# Patient Record
Sex: Male | Born: 2000 | Race: Black or African American | Hispanic: No | Marital: Single | Smoking: Never smoker
Health system: Southern US, Community
[De-identification: ages and names within clinical notes are randomized; demographics above are authoritative.]

## PROBLEM LIST (undated history)

## (undated) DIAGNOSIS — J3501 Chronic tonsillitis: Secondary | ICD-10-CM

## (undated) DIAGNOSIS — M41129 Adolescent idiopathic scoliosis, site unspecified: Secondary | ICD-10-CM

## (undated) DIAGNOSIS — H9325 Central auditory processing disorder: Secondary | ICD-10-CM

## (undated) DIAGNOSIS — K2100 Gastro-esophageal reflux disease with esophagitis, without bleeding: Secondary | ICD-10-CM

## (undated) DIAGNOSIS — G43909 Migraine, unspecified, not intractable, without status migrainosus: Secondary | ICD-10-CM

## (undated) DIAGNOSIS — T7840XA Allergy, unspecified, initial encounter: Secondary | ICD-10-CM

## (undated) HISTORY — DX: Adolescent idiopathic scoliosis, site unspecified: M41.129

## (undated) HISTORY — DX: Chronic tonsillitis: J35.01

## (undated) HISTORY — DX: Allergy, unspecified, initial encounter: T78.40XA

## (undated) HISTORY — PX: DEEP NECK LYMPH NODE BIOPSY / EXCISION: SUR126

## (undated) HISTORY — DX: Central auditory processing disorder: H93.25

## (undated) HISTORY — PX: CIRCUMCISION: SUR203

## (undated) HISTORY — DX: Gastro-esophageal reflux disease with esophagitis, without bleeding: K21.00

---
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
## 2022-01-17 ENCOUNTER — Other Ambulatory Visit: Payer: Self-pay

## 2022-01-17 ENCOUNTER — Emergency Department (HOSPITAL_BASED_OUTPATIENT_CLINIC_OR_DEPARTMENT_OTHER)
Admission: EM | Admit: 2022-01-17 | Discharge: 2022-01-17 | Disposition: A | Discharge status: Home / Self Care | Payer: BC Managed Care – PPO | Attending: Emergency Medicine | Admitting: Emergency Medicine

## 2022-01-17 ENCOUNTER — Encounter (HOSPITAL_BASED_OUTPATIENT_CLINIC_OR_DEPARTMENT_OTHER): Payer: Self-pay | Admitting: Pediatrics

## 2022-01-17 DIAGNOSIS — E878 Other disorders of electrolyte and fluid balance, not elsewhere classified: Secondary | ICD-10-CM | POA: Insufficient documentation

## 2022-01-17 DIAGNOSIS — R112 Nausea with vomiting, unspecified: Secondary | ICD-10-CM | POA: Diagnosis present

## 2022-01-17 DIAGNOSIS — D72829 Elevated white blood cell count, unspecified: Secondary | ICD-10-CM | POA: Diagnosis not present

## 2022-01-17 LAB — CBC
HCT: 44.4 % (ref 39.0–52.0)
Hemoglobin: 16.4 g/dL (ref 13.0–17.0)
MCH: 32.7 pg (ref 26.0–34.0)
MCHC: 36.9 g/dL — ABNORMAL HIGH (ref 30.0–36.0)
MCV: 88.4 fL (ref 80.0–100.0)
Platelets: 259 10*3/uL (ref 150–400)
RBC: 5.02 MIL/uL (ref 4.22–5.81)
RDW: 11.9 % (ref 11.5–15.5)
WBC: 12 10*3/uL — ABNORMAL HIGH (ref 4.0–10.5)
nRBC: 0 % (ref 0.0–0.2)

## 2022-01-17 LAB — COMPREHENSIVE METABOLIC PANEL
ALT: 27 U/L (ref 0–44)
AST: 28 U/L (ref 15–41)
Albumin: 4.9 g/dL (ref 3.5–5.0)
Alkaline Phosphatase: 95 U/L (ref 38–126)
Anion gap: 13 (ref 5–15)
BUN: 12 mg/dL (ref 6–20)
CO2: 21 mmol/L — ABNORMAL LOW (ref 22–32)
Calcium: 9.7 mg/dL (ref 8.9–10.3)
Chloride: 104 mmol/L (ref 98–111)
Creatinine, Ser: 0.77 mg/dL (ref 0.61–1.24)
GFR, Estimated: >60 mL/min (ref >60)
Glucose, Bld: 99 mg/dL (ref 70–99)
Potassium: 3.5 mmol/L (ref 3.5–5.1)
Sodium: 138 mmol/L (ref 135–145)
Total Bilirubin: 1.5 mg/dL — ABNORMAL HIGH (ref 0.3–1.2)
Total Protein: 8.7 g/dL — ABNORMAL HIGH (ref 6.5–8.1)

## 2022-01-17 LAB — LIPASE, BLOOD: Lipase: 25 U/L (ref 11–51)

## 2022-01-17 LAB — URINALYSIS, ROUTINE W REFLEX MICROSCOPIC
Bilirubin Urine: NEGATIVE
Glucose, UA: NEGATIVE mg/dL
Ketones, ur: >=80 mg/dL — AB
Leukocytes,Ua: NEGATIVE
Nitrite: NEGATIVE
Protein, ur: >=300 mg/dL — AB
Specific Gravity, Urine: >=1.03 (ref 1.005–1.030)
pH: 6 (ref 5.0–8.0)

## 2022-01-17 LAB — URINALYSIS, MICROSCOPIC (REFLEX)

## 2022-01-17 MED ORDER — PROMETHAZINE HCL 25 MG PO TABS: 25.0000 mg | ORAL_TABLET | Freq: Four times a day (QID) | ORAL | 0 refills | Status: DC | PRN

## 2022-01-17 MED ORDER — ONDANSETRON 4 MG PO TBDP
4.0000 mg | ORAL_TABLET | Freq: Once | ORAL | Status: AC | PRN
  Administered 2022-01-17: 4 mg via ORAL
  Filled 2022-01-17: qty 1

## 2022-01-17 MED ORDER — LACTATED RINGERS IV BOLUS
1000.0000 mL | Freq: Once | INTRAVENOUS | Status: AC
  Administered 2022-01-17: 1000 mL via INTRAVENOUS

## 2022-01-17 MED ORDER — SODIUM CHLORIDE 0.9 % IV SOLN
25.0000 mg | Freq: Once | INTRAVENOUS | Status: AC
  Administered 2022-01-17: 25 mg via INTRAVENOUS
  Filled 2022-01-17: qty 1

## 2022-01-17 MED ORDER — PROMETHAZINE HCL 25 MG/ML IJ SOLN
INTRAMUSCULAR | Status: AC
  Filled 2022-01-17: qty 1

## 2022-01-17 MED ORDER — FAMOTIDINE IN NACL 20-0.9 MG/50ML-% IV SOLN
20.0000 mg | Freq: Once | INTRAVENOUS | Status: AC
  Administered 2022-01-17: 20 mg via INTRAVENOUS
  Filled 2022-01-17: qty 50

## 2022-01-17 NOTE — ED Provider Notes (Signed)
[facility] EMERGENCY DEPARTMENT Provider Note   CSN: [id number] Arrival date & time: 01/17/22  0856     History  Chief Complaint  Patient presents with   Nausea   Emesis    Donald Curtis is a 21 y.o. male.  He has no significant past medical history.  Complaining of nausea and vomiting that started 2 days ago.  Worse since midnight.  No abdominal pain fevers diarrhea.  Question saw small amount of blood today.  Similar episode about a year ago.  No troubles in between.  No sick contacts or recent travel.  The history is provided by the patient and a parent.  Emesis Severity:  Severe Duration:  3 days Timing:  Intermittent Progression:  Worsening Chronicity:  Recurrent Recent urination:  Normal Relieved by:  Nothing Worsened by:  Nothing Ineffective treatments:  Antiemetics Associated symptoms: no abdominal pain, no chills, no cough, no diarrhea, no fever and no sore throat   Risk factors: no prior abdominal surgery, no sick contacts, no suspect food intake and no travel to endemic areas        Home Medications Prior to Admission medications   Medication Sig Start Date End Date Taking? Authorizing Provider  famotidine (PEPCID) 40 MG tablet Take 1 tablet (40 mg total) by mouth daily. 10/25/21  Yes Plotnikov, Georgina Quint, MD  FLUoxetine (PROZAC) 20 MG capsule Take 1 capsule (20 mg total) by mouth daily. 12/07/21 12/07/22 Yes Plotnikov, Georgina Quint, MD  loratadine (CLARITIN) 10 MG tablet Take 1 tablet (10 mg total) by mouth daily. 02/20/19  Yes Georgiann Hahn, MD  Multiple Vitamin (MULTIVITAMIN) tablet Take 1 tablet by mouth daily.   Yes [provider]      Allergies    Patient has no known allergies.    Review of Systems   Review of Systems  Constitutional:  Negative for chills and fever.  HENT:  Negative for sore throat.   Respiratory:  Negative for cough.   Gastrointestinal:  Positive for nausea and vomiting. Negative for abdominal pain and  diarrhea.    Physical Exam Updated Vital Signs BP 135/83 (BP Location: Right Arm)   Pulse 84   Temp 97.9 F (36.6 C) (Oral)   Resp 18   Ht 5\' 8"  (1.727 m)   Wt 46.6 kg   SpO2 99%   BMI 15.62 kg/m  Physical Exam Vitals and nursing note reviewed.  Constitutional:      General: He is not in acute distress.    Appearance: Normal appearance. He is well-developed.  HENT:     Head: Normocephalic and atraumatic.  Eyes:     Conjunctiva/sclera: Conjunctivae normal.  Cardiovascular:     Rate and Rhythm: Normal rate and regular rhythm.     Heart sounds: No murmur heard. Pulmonary:     Effort: Pulmonary effort is normal. No respiratory distress.     Breath sounds: Normal breath sounds.  Abdominal:     General: There is no distension.     Palpations: Abdomen is soft. There is no mass.     Tenderness: There is no abdominal tenderness. There is no guarding or rebound.     Hernia: No hernia is present.  Musculoskeletal:        General: No swelling.     Cervical back: Neck supple.  Skin:    General: Skin is warm and dry.     Capillary Refill: Capillary refill takes less than 2 seconds.  Neurological:     General:  No focal deficit present.     Mental Status: He is alert.     ED Results / Procedures / Treatments   Labs (all labs ordered are listed, but only abnormal results are displayed) Labs Reviewed  COMPREHENSIVE METABOLIC PANEL - Abnormal; Notable for the following components:      Result Value   CO2 21 (*)    Total Protein 8.7 (*)    Total Bilirubin 1.5 (*)    All other components within normal limits  CBC - Abnormal; Notable for the following components:   WBC 12.0 (*)    MCHC 36.9 (*)    All other components within normal limits  URINALYSIS, ROUTINE W REFLEX MICROSCOPIC - Abnormal; Notable for the following components:   APPearance HAZY (*)    Hgb urine dipstick TRACE (*)    Ketones, ur >=80 (*)    Protein, ur >=300 (*)    All other components within normal  limits  URINALYSIS, MICROSCOPIC (REFLEX) - Abnormal; Notable for the following components:   Bacteria, UA MANY (*)    All other components within normal limits  LIPASE, BLOOD    EKG None  Radiology No results found.  Procedures Procedures    Medications Ordered in ED Medications  lactated ringers bolus 1,000 mL (has no administration in time range)  promethazine (PHENERGAN) 25 mg in sodium chloride 0.9 % 50 mL IVPB (has no administration in time range)  ondansetron (ZOFRAN-ODT) disintegrating tablet 4 mg (4 mg Oral Given 01/17/22 0933)    ED Course/ Medical Decision Making/ A&P Clinical Course as of 01/17/22 1716  Mon Jan 17, 2022  1324 Patient feeling better now and would like to go home.  He is already on Pepcid.  Will provide prescription for Phenergan.  Recommended PCP follow-up and consideration for GI referral. [MB]    Clinical Course User Index [MB] Hayden Rasmussen, MD                           Medical Decision Making Amount and/or Complexity of Data Reviewed Labs: ordered.  Risk Prescription drug management.   This patient complains of nausea and vomiting; this involves an extensive number of treatment Options and is a complaint that carries with it a high risk of complications and morbidity. The differential includes nausea and vomiting, gastritis, peptic ulcer disease, hyperemesis, gastroenteritis  I ordered, reviewed and interpreted labs, which included CBC with mildly elevated white count normal hemoglobin, chemistries mildly low bicarb and elevated T. bili normal gap, urinalysis with ketones protein I ordered medication IV fluids IV nausea medication and acid medication and reviewed PMP when indicated. Additional history obtained from patient's mother Previous records obtained and reviewed in epic including prior ED visit 1 year ago for similar presentation Cardiac monitoring reviewed, normal sinus rhythm Social determinants considered, no significant  barrier Critical Interventions: None  After the interventions stated above, I reevaluated the patient and found patient to be awake alert tolerating p.o. Admission and further testing considered, no indications for admission or further work-up at this time.  Recommended follow-up with PCP for further evaluation testing and possible GI referral.         Final Clinical Impression(s) / ED Diagnoses Final diagnoses:  Nausea and vomiting, unspecified vomiting type    Rx / DC Orders ED Discharge Orders          Ordered    promethazine (PHENERGAN) 25 MG tablet  Every 6 hours PRN  01/17/22 1325              Terrilee Files, MD 01/17/22 [phone]

## 2022-01-17 NOTE — ED Notes (Signed)
Pt ambulated to BR with steady gait.

## 2022-01-17 NOTE — ED Triage Notes (Signed)
C/O nausea and vomiting startin Friday; and became more consistent throughout the night Saturday, unable to keep anything down. Denies pain when asked.

## 2022-01-17 NOTE — ED Notes (Signed)
Attempted to call pharmacy for phenergan. No answer.

## 2022-01-17 NOTE — Discharge Instructions (Signed)
You were seen in the emergency department for nausea and vomiting.  You had lab work done that did not show an obvious explanation for your symptoms.  You felt better after some fluids nausea medication.  We are prescribing you nausea medication for home.  Please start with a clear liquid diet advance as tolerated.  Follow-up with your regular doctor and discuss with them possible referral to GI.

## 2022-01-17 NOTE — ED Notes (Signed)
Writer gave patient ginger ale for PO challenge.

## 2022-01-19 ENCOUNTER — Telehealth: Payer: Self-pay | Admitting: *Deleted

## 2022-01-19 NOTE — Telephone Encounter (Signed)
Transition Care Management Follow-up Telephone Call Date of discharge and from where: 01/17/22 1:26 pm How have you been since you were released from the hospital? Pt states fine Any questions or concerns? No  Items Reviewed: Did the pt receive and understand the discharge instructions provided? Yes  Medications obtained and verified? Yes and pt states they gave him some nausea meds Other?  Any new allergies since your discharge? No  Dietary orders reviewed? No Do you have support at home? Yes   Home Care and Equipment/Supplies: Were home health services ordered? not applicable If so, what is the name of the agency?   Has the agency set up a time to come to the patient's home? not applicable Were any new equipment or medical supplies ordered?  No What is the name of the medical supply agency?  Were you able to get the supplies/equipment? not applicable Do you have any questions related to the use of the equipment or supplies? No  Functional Questionnaire: (I = Independent and D = Dependent) ADLs: Yes  Bathing/Dressing- Yes   Meal Prep- no  Eating- yes  Maintaining continence- yes  Transferring/Ambulation- yes  Managing Meds- yes  Follow up appointments reviewed:  PCP Hospital f/u appt confirmed? Yes  Scheduled to see Dr. Alain Marion on 02/02/22  Specialist Hospital f/u appt confirmed? No   Are transportation arrangements needed? No  If their condition worsens, is the pt aware to call PCP or go to the Emergency Dept.? Yes Was the patient provided with contact information for the PCP's office or ED? Yes Was to pt encouraged to call back with questions or concerns? Yes 01/17/22

## 2022-02-02 ENCOUNTER — Encounter: Payer: Self-pay | Admitting: Internal Medicine

## 2022-02-02 ENCOUNTER — Ambulatory Visit: Payer: BC Managed Care – PPO | Admitting: Internal Medicine

## 2022-02-02 DIAGNOSIS — R110 Nausea: Secondary | ICD-10-CM | POA: Diagnosis not present

## 2022-02-02 MED ORDER — PROMETHAZINE HCL 25 MG PO TABS: 25.0000 mg | ORAL_TABLET | ORAL | 1 refills | Status: DC | PRN

## 2022-02-02 NOTE — Progress Notes (Signed)
Subjective:  Patient ID: Donald Curtis, male    DOB: 2001/02/03  Age: 21 y.o. MRN: [id number]  CC: Follow-up (Er FOLLOW-UP)   HPI Donald Curtis presents for a post ER visit for intractable nausea and vomiting on 01/17/2022.  He has had several episodes like this over past several years.  He did have a GI work-up done.  Nausea and vomiting seems to be able to stop with promethazine.  ER note/test from 01/17/2022 reviewed  The patient is here with his mother  Outpatient Medications Prior to Visit  Medication Sig Dispense Refill   famotidine (PEPCID) 40 MG tablet Take 1 tablet (40 mg total) by mouth daily. 90 tablet 3   FLUoxetine (PROZAC) 20 MG capsule Take 1 capsule (20 mg total) by mouth daily. 30 capsule 11   loratadine (CLARITIN) 10 MG tablet Take 1 tablet (10 mg total) by mouth daily. 30 tablet 6   Multiple Vitamin (MULTIVITAMIN) tablet Take 1 tablet by mouth daily.     promethazine (PHENERGAN) 25 MG tablet Take 1 tablet (25 mg total) by mouth every 6 (six) hours as needed for nausea or vomiting. (Patient not taking: Reported on 02/02/2022) 30 tablet 0   No facility-administered medications prior to visit.    ROS: Review of Systems  Constitutional:  Negative for appetite change, fatigue and unexpected weight change.  HENT:  Negative for congestion, nosebleeds, sneezing, sore throat and trouble swallowing.   Eyes:  Negative for itching and visual disturbance.  Respiratory:  Negative for cough.   Cardiovascular:  Negative for chest pain, palpitations and leg swelling.  Gastrointestinal:  Negative for abdominal distention, blood in stool, diarrhea and nausea.  Genitourinary:  Negative for frequency and hematuria.  Musculoskeletal:  Negative for back pain, gait problem, joint swelling and neck pain.  Skin:  Negative for rash.  Neurological:  Negative for dizziness, tremors, speech difficulty and weakness.  Psychiatric/Behavioral:  Negative for agitation, dysphoric mood and sleep  disturbance. The patient is nervous/anxious.     Objective:  BP 120/68 (BP Location: Left Arm)   Pulse 99   Temp 98.5 F (36.9 C) (Oral)   Ht 5' 8.5" (1.74 m)   Wt 111 lb (50.3 kg)   SpO2 96%   BMI 16.63 kg/m   BP Readings from Last 3 Encounters:  02/02/22 120/68  01/17/22 121/85  12/07/21 (!) 138/92    Wt Readings from Last 3 Encounters:  02/02/22 111 lb (50.3 kg) (<1 %, Z= -2.43)*  01/17/22 102 lb 11.2 oz (46.6 kg) (<1 %, Z= -3.12)*  12/07/21 113 lb 3.2 oz (51.3 kg) (1 %, Z= -2.24)*   * Growth percentiles are based on CDC (Boys, 2-20 Years) data.    Physical Exam Constitutional:      General: He is not in acute distress.    Appearance: He is well-developed.     Comments: NAD  Eyes:     Conjunctiva/sclera: Conjunctivae normal.     Pupils: Pupils are equal, round, and reactive to light.  Neck:     Thyroid: No thyromegaly.     Vascular: No JVD.  Cardiovascular:     Rate and Rhythm: Normal rate and regular rhythm.     Heart sounds: Normal heart sounds. No murmur heard.    No friction rub. No gallop.  Pulmonary:     Effort: Pulmonary effort is normal. No respiratory distress.     Breath sounds: Normal breath sounds. No wheezing or rales.  Chest:     Chest  wall: No tenderness.  Abdominal:     General: Bowel sounds are normal. There is no distension.     Palpations: Abdomen is soft. There is no mass.     Tenderness: There is no abdominal tenderness. There is no guarding or rebound.  Musculoskeletal:        General: No tenderness. Normal range of motion.     Cervical back: Normal range of motion.  Lymphadenopathy:     Cervical: No cervical adenopathy.  Skin:    General: Skin is warm and dry.     Findings: No rash.  Neurological:     Mental Status: He is alert and oriented to person, place, and time.     Cranial Nerves: No cranial nerve deficit.     Motor: No abnormal muscle tone.     Coordination: Coordination normal.     Gait: Gait normal.     Deep Tendon  Reflexes: Reflexes are normal and symmetric.  Psychiatric:        Behavior: Behavior normal.        Thought Content: Thought content normal.        Judgment: Judgment normal.   The patient is thin.  He looks well.  Lab Results  Component Value Date   WBC 12.0 (H) 01/17/2022   HGB 16.4 01/17/2022   HCT 44.4 01/17/2022   PLT 259 01/17/2022   GLUCOSE 99 01/17/2022   ALT 27 01/17/2022   AST 28 01/17/2022   NA 138 01/17/2022   K 3.5 01/17/2022   CL 104 01/17/2022   CREATININE 0.77 01/17/2022   BUN 12 01/17/2022   CO2 21 (L) 01/17/2022   TSH 0.98 10/25/2021    No results found.  Assessment & Plan:   Problem List Items Addressed This Visit     Nausea    S/p ER visit for intractable nausea and vomiting on 01/17/2022.  He has had several episodes like this over past several years.  He did have a GI work-up done.  Nausea and vomiting seems to be able to stop with promethazine.  ER note/test from 01/17/2022 reviewed.  Promethazine prescribed.  If she is out of town and if promethazine is not available, she can take Benadryl 25-50 mg every 4 hours as needed         Meds ordered this encounter  Medications   promethazine (PHENERGAN) 25 MG tablet    Sig: Take 1-2 tablets (25-50 mg total) by mouth every 4 (four) hours as needed for nausea or vomiting.    Dispense:  60 tablet    Refill:  1      Follow-up: Return in about 6 months (around 08/04/2022) for a follow-up visit.  Sonda Primes, MD

## 2022-02-06 NOTE — Assessment & Plan Note (Addendum)
S/p ER visit for intractable nausea and vomiting on 01/17/2022.  He has had several episodes like this over past several years.  He did have a GI work-up done.  Nausea and vomiting seems to be able to stop with promethazine.  ER note/test from 01/17/2022 reviewed.  Promethazine prescribed.  If she is out of town and if promethazine is not available, she can take Benadryl 25-50 mg every 4 hours as needed

## 2022-10-24 ENCOUNTER — Telehealth: Payer: Self-pay

## 2022-10-24 NOTE — Telephone Encounter (Signed)
LVM for patient to call back [phone], or to call PCP office to schedule follow up apt. AS, CMA  

## 2022-12-20 ENCOUNTER — Encounter: Payer: Self-pay | Admitting: Pediatrics

## 2023-01-12 ENCOUNTER — Other Ambulatory Visit: Payer: Self-pay | Admitting: Internal Medicine

## 2023-01-13 ENCOUNTER — Other Ambulatory Visit: Payer: Self-pay | Admitting: Internal Medicine

## 2023-01-14 NOTE — Telephone Encounter (Signed)
Needs office visit.

## 2023-02-17 ENCOUNTER — Other Ambulatory Visit: Payer: Self-pay | Admitting: Internal Medicine

## 2023-05-07 ENCOUNTER — Other Ambulatory Visit: Payer: Self-pay | Admitting: Internal Medicine

## 2023-06-03 ENCOUNTER — Other Ambulatory Visit: Payer: Self-pay | Admitting: Internal Medicine

## 2023-07-30 ENCOUNTER — Other Ambulatory Visit: Payer: Self-pay | Admitting: Internal Medicine

## 2023-09-17 ENCOUNTER — Other Ambulatory Visit: Payer: Self-pay | Admitting: Internal Medicine

## 2023-10-10 ENCOUNTER — Other Ambulatory Visit: Payer: Self-pay | Admitting: Internal Medicine

## 2023-11-15 ENCOUNTER — Other Ambulatory Visit: Payer: Self-pay | Admitting: Internal Medicine

## 2023-11-20 NOTE — Telephone Encounter (Unsigned)
 Copied from CRM [id number]. Topic: Clinical - Medication Refill >> Nov 20, 2023  4:58 PM Jasmin G wrote: Medication: FLUoxetine (PROZAC) 20 MG capsule  Has the patient contacted their pharmacy? No. Pt's mom is aware that pt hasn't had an appt in a while so she wasn't sure if he needed to come in for an appt before getting the refill, I offered to place the refill since I seen that the prescription was active until next year, if incorrect, pt does have an appt already on Aug 18th. (Agent: If no, request that the patient contact the pharmacy for the refill. If patient does not wish to contact the pharmacy document the reason why and proceed with request.) (Agent: If yes, when and what did the pharmacy advise?)  This is the patient's preferred pharmacy:  [facility] [facility] [address] - [place], [place] - [address] [facility] [address] [place] [address] Phone: [phone] Fax: [phone]  Is this the correct pharmacy for this prescription? Yes If no, delete pharmacy and type the correct one.   Has the prescription been filled recently? Yes  Is the patient out of the medication? No  Has the patient been seen for an appointment in the last year OR does the patient have an upcoming appointment? Yes  Can we respond through MyChart? No  Agent: Please be advised that Rx refills may take up to 3 business days. We ask that you follow-up with your pharmacy.

## 2023-11-21 ENCOUNTER — Other Ambulatory Visit: Payer: Self-pay | Admitting: Internal Medicine

## 2023-11-27 ENCOUNTER — Encounter: Payer: Self-pay | Admitting: Internal Medicine

## 2023-11-27 ENCOUNTER — Ambulatory Visit: Admitting: Internal Medicine

## 2023-11-27 VITALS — BP 126/82 | HR 122 | Temp 98.8°F | Ht 68.5 in | Wt 107.0 lb

## 2023-11-27 DIAGNOSIS — F419 Anxiety disorder, unspecified: Secondary | ICD-10-CM | POA: Diagnosis not present

## 2023-11-27 DIAGNOSIS — R110 Nausea: Secondary | ICD-10-CM | POA: Diagnosis not present

## 2023-11-27 DIAGNOSIS — K219 Gastro-esophageal reflux disease without esophagitis: Secondary | ICD-10-CM | POA: Diagnosis not present

## 2023-11-27 DIAGNOSIS — Z68.41 Body mass index (BMI) pediatric, 5th percentile to less than 85th percentile for age: Secondary | ICD-10-CM | POA: Diagnosis not present

## 2023-11-27 MED ORDER — FLUOXETINE HCL 40 MG PO CAPS
40.0000 mg | ORAL_CAPSULE | Freq: Every day | ORAL | 1 refills | Status: AC
Start: 2023-11-27 — End: ?

## 2023-11-27 MED ORDER — PROMETHAZINE HCL 25 MG PO TABS: 25.0000 mg | ORAL_TABLET | Freq: Four times a day (QID) | ORAL | 1 refills | Status: DC | PRN

## 2023-11-27 MED ORDER — FAMOTIDINE 40 MG PO TABS: 40.0000 mg | ORAL_TABLET | Freq: Every day | ORAL | 1 refills | Status: DC

## 2023-11-27 NOTE — Assessment & Plan Note (Signed)
On Pepcid prn

## 2023-11-27 NOTE — Progress Notes (Signed)
 Subjective:  Patient ID: Donald Curtis, male    DOB: 06/29/2000  Age: 23 y.o. MRN: [id number]  CC: Medical Management of Chronic Issues (Discuss medications and possible higher dosage )   HPI Donald Curtis presents for chronic nausea, anxiety Pt wants to try Fluoxetine  at a higher dose He is here w/mom  Outpatient Medications Prior to Visit  Medication Sig Dispense Refill   loratadine  (CLARITIN ) 10 MG tablet Take 1 tablet (10 mg total) by mouth daily. 30 tablet 6   Multiple Vitamin (MULTIVITAMIN) tablet Take 1 tablet by mouth daily.     famotidine  (PEPCID ) 40 MG tablet TAKE 1 TABLET(40 MG) BY MOUTH DAILY 30 tablet 1   promethazine  (PHENERGAN ) 25 MG tablet TAKE 1 TO 2 TABLETS(25 TO 50 MG) BY MOUTH EVERY 4 HOURS AS NEEDED FOR NAUSEA OR VOMITING 30 tablet 0   FLUoxetine  (PROZAC ) 20 MG capsule TAKE 1 CAPSULE(20 MG) BY MOUTH DAILY (Patient not taking: Reported on 11/27/2023) 30 capsule 0   No facility-administered medications prior to visit.    ROS: Review of Systems  Constitutional:  Negative for appetite change, fatigue and unexpected weight change.  HENT:  Negative for congestion, nosebleeds, sneezing, sore throat and trouble swallowing.   Eyes:  Negative for itching and visual disturbance.  Respiratory:  Negative for cough.   Cardiovascular:  Negative for chest pain, palpitations and leg swelling.  Gastrointestinal:  Negative for abdominal distention, blood in stool, diarrhea and nausea.  Genitourinary:  Negative for frequency and hematuria.  Musculoskeletal:  Negative for back pain, gait problem, joint swelling and neck pain.  Skin:  Negative for rash.  Neurological:  Negative for dizziness, tremors, speech difficulty and weakness.  Psychiatric/Behavioral:  Negative for agitation, dysphoric mood, sleep disturbance and suicidal ideas. The patient is nervous/anxious.     Objective:  BP 126/82   Pulse (!) 122   Temp 98.8 F (37.1 C) (Oral)   Ht 5' 8.5 (1.74 m)   Wt 107  lb (48.5 kg)   SpO2 98%   BMI 16.03 kg/m   BP Readings from Last 3 Encounters:  11/27/23 126/82  02/02/22 120/68  01/17/22 121/85    Wt Readings from Last 3 Encounters:  11/27/23 107 lb (48.5 kg)  02/02/22 111 lb (50.3 kg) (<1%, Z= -2.43)*  01/17/22 102 lb 11.2 oz (46.6 kg) (<1%, Z= -3.12)*   * Growth percentiles are based on CDC (Boys, 2-20 Years) data.    Physical Exam Constitutional:      General: He is not in acute distress.    Appearance: Normal appearance. He is well-developed.     Comments: NAD  Eyes:     Conjunctiva/sclera: Conjunctivae normal.     Pupils: Pupils are equal, round, and reactive to light.  Neck:     Thyroid: No thyromegaly.     Vascular: No JVD.  Cardiovascular:     Rate and Rhythm: Regular rhythm. Tachycardia present.     Heart sounds: Normal heart sounds. No murmur heard.    No friction rub. No gallop.  Pulmonary:     Effort: Pulmonary effort is normal. No respiratory distress.     Breath sounds: Normal breath sounds. No wheezing or rales.  Chest:     Chest wall: No tenderness.  Abdominal:     General: Bowel sounds are normal. There is no distension.     Palpations: Abdomen is soft. There is no mass.     Tenderness: There is no abdominal tenderness. There is no guarding  or rebound.  Musculoskeletal:        General: No tenderness. Normal range of motion.     Cervical back: Normal range of motion.     Right lower leg: No edema.     Left lower leg: No edema.  Lymphadenopathy:     Cervical: No cervical adenopathy.  Skin:    General: Skin is warm and dry.     Findings: No rash.  Neurological:     Mental Status: He is alert and oriented to person, place, and time.     Cranial Nerves: No cranial nerve deficit.     Motor: No abnormal muscle tone.     Coordination: Coordination normal.     Gait: Gait normal.     Deep Tendon Reflexes: Reflexes are normal and symmetric.  Psychiatric:        Behavior: Behavior normal.        Thought Content:  Thought content normal.        Judgment: Judgment normal.   Anxious, mild tremor Mild tachycardia Thin  Lab Results  Component Value Date   WBC 12.0 (H) 01/17/2022   HGB 16.4 01/17/2022   HCT 44.4 01/17/2022   PLT 259 01/17/2022   GLUCOSE 99 01/17/2022   ALT 27 01/17/2022   AST 28 01/17/2022   NA 138 01/17/2022   K 3.5 01/17/2022   CL 104 01/17/2022   CREATININE 0.77 01/17/2022   BUN 12 01/17/2022   CO2 21 (L) 01/17/2022   TSH 0.98 10/25/2021    No results found.  Assessment & Plan:   Problem List Items Addressed This Visit     Anxiety disorder - Primary   Worse Increase Fluoxetine  to 40 mg/d      Relevant Medications   FLUoxetine  (PROZAC ) 40 MG capsule   RESOLVED: BMI (body mass index), pediatric, 5% to less than 85% for age   On Pepcid  prn Treat anxiety Increase Fluoxetine  to 40 mg/d      GERD (gastroesophageal reflux disease)   On Pepcid  prn      Relevant Medications   famotidine  (PEPCID ) 40 MG tablet   Nausea   Treat anxiety. Fluoxetine  helps Promethazine  prescribed.  If she is out of town and if promethazine  is not available, he can take Benadryl 25-50 mg every 4 hours as needed         Meds ordered this encounter  Medications   promethazine  (PHENERGAN ) 25 MG tablet    Sig: Take 1 tablet (25 mg total) by mouth every 6 (six) hours as needed for nausea or vomiting.    Dispense:  60 tablet    Refill:  1    Schedule office visit   FLUoxetine  (PROZAC ) 40 MG capsule    Sig: Take 1 capsule (40 mg total) by mouth daily.    Dispense:  90 capsule    Refill:  1   famotidine  (PEPCID ) 40 MG tablet    Sig: Take 1 tablet (40 mg total) by mouth daily.    Dispense:  90 tablet    Refill:  1    Schedule office visit      Follow-up: Return in about 6 weeks (around 01/08/2024) for a follow-up visit.  Marolyn Noel, MD

## 2023-11-27 NOTE — Assessment & Plan Note (Addendum)
 On Pepcid  prn Treat anxiety Increase Fluoxetine  to 40 mg/d

## 2023-11-27 NOTE — Assessment & Plan Note (Addendum)
 Treat anxiety. Fluoxetine  helps Promethazine  prescribed.  If she is out of town and if promethazine  is not available, he can take Benadryl 25-50 mg every 4 hours as needed

## 2023-11-27 NOTE — Assessment & Plan Note (Signed)
 Worse Increase Fluoxetine  to 40 mg/d

## 2024-03-05 ENCOUNTER — Other Ambulatory Visit: Payer: Self-pay

## 2024-03-05 ENCOUNTER — Encounter (HOSPITAL_BASED_OUTPATIENT_CLINIC_OR_DEPARTMENT_OTHER): Payer: Self-pay

## 2024-03-05 ENCOUNTER — Emergency Department (HOSPITAL_BASED_OUTPATIENT_CLINIC_OR_DEPARTMENT_OTHER)

## 2024-03-05 ENCOUNTER — Emergency Department (HOSPITAL_BASED_OUTPATIENT_CLINIC_OR_DEPARTMENT_OTHER)
Admission: EM | Admit: 2024-03-05 | Discharge: 2024-03-05 | Disposition: A | Discharge status: Home / Self Care | Attending: Emergency Medicine | Admitting: Emergency Medicine

## 2024-03-05 DIAGNOSIS — R112 Nausea with vomiting, unspecified: Secondary | ICD-10-CM | POA: Diagnosis not present

## 2024-03-05 DIAGNOSIS — E876 Hypokalemia: Secondary | ICD-10-CM | POA: Insufficient documentation

## 2024-03-05 DIAGNOSIS — R1031 Right lower quadrant pain: Secondary | ICD-10-CM | POA: Insufficient documentation

## 2024-03-05 LAB — COMPREHENSIVE METABOLIC PANEL WITH GFR
ALT: 17 U/L (ref 0–44)
AST: 32 U/L (ref 15–41)
Albumin: 4.7 g/dL (ref 3.5–5.0)
Alkaline Phosphatase: 76 U/L (ref 38–126)
Anion gap: 20 — ABNORMAL HIGH (ref 5–15)
BUN: 21 mg/dL — ABNORMAL HIGH (ref 6–20)
CO2: 19 mmol/L — ABNORMAL LOW (ref 22–32)
Calcium: 9.6 mg/dL (ref 8.9–10.3)
Chloride: 99 mmol/L (ref 98–111)
Creatinine, Ser: 0.96 mg/dL (ref 0.61–1.24)
GFR, Estimated: >60 mL/min (ref >60)
Glucose, Bld: 179 mg/dL — ABNORMAL HIGH (ref 70–99)
Potassium: 2.6 mmol/L — CL (ref 3.5–5.1)
Sodium: 139 mmol/L (ref 135–145)
Total Bilirubin: 0.6 mg/dL (ref 0.0–1.2)
Total Protein: 7.7 g/dL (ref 6.5–8.1)

## 2024-03-05 LAB — URINALYSIS, ROUTINE W REFLEX MICROSCOPIC
Bilirubin Urine: NEGATIVE
Glucose, UA: NEGATIVE mg/dL
Ketones, ur: 40 mg/dL — AB
Leukocytes,Ua: NEGATIVE
Nitrite: NEGATIVE
Protein, ur: NEGATIVE mg/dL
Specific Gravity, Urine: 1.02 (ref 1.005–1.030)
pH: 7.5 (ref 5.0–8.0)

## 2024-03-05 LAB — CBC
HCT: 38.6 % — ABNORMAL LOW (ref 39.0–52.0)
Hemoglobin: 14 g/dL (ref 13.0–17.0)
MCH: 32.1 pg (ref 26.0–34.0)
MCHC: 36.3 g/dL — ABNORMAL HIGH (ref 30.0–36.0)
MCV: 88.5 fL (ref 80.0–100.0)
Platelets: 249 K/uL (ref 150–400)
RBC: 4.36 MIL/uL (ref 4.22–5.81)
RDW: 11.9 % (ref 11.5–15.5)
WBC: 15.4 K/uL — ABNORMAL HIGH (ref 4.0–10.5)
nRBC: 0 % (ref 0.0–0.2)

## 2024-03-05 LAB — URINALYSIS, MICROSCOPIC (REFLEX): RBC / HPF: >50 RBC/hpf (ref 0–5)

## 2024-03-05 LAB — URINE DRUG SCREEN
Amphetamines: NEGATIVE
Barbiturates: NEGATIVE
Benzodiazepines: NEGATIVE
Cocaine: NEGATIVE
Fentanyl: NEGATIVE
Methadone Scn, Ur: NEGATIVE
Opiates: POSITIVE — AB
Tetrahydrocannabinol: POSITIVE — AB

## 2024-03-05 LAB — LIPASE, BLOOD: Lipase: 16 U/L (ref 11–51)

## 2024-03-05 MED ORDER — OXYCODONE-ACETAMINOPHEN 5-325 MG PO TABS
1.0000 | ORAL_TABLET | Freq: Four times a day (QID) | ORAL | 0 refills | Status: AC | PRN
Start: 2024-03-05 — End: ?

## 2024-03-05 MED ORDER — SODIUM CHLORIDE 0.9 % IV BOLUS
1000.0000 mL | Freq: Once | INTRAVENOUS | Status: AC
  Administered 2024-03-05: 1000 mL via INTRAVENOUS

## 2024-03-05 MED ORDER — POTASSIUM CHLORIDE CRYS ER 20 MEQ PO TBCR
40.0000 meq | EXTENDED_RELEASE_TABLET | Freq: Every day | ORAL | 0 refills | Status: AC
Start: 2024-03-05 — End: 2024-03-09

## 2024-03-05 MED ORDER — POTASSIUM CHLORIDE CRYS ER 20 MEQ PO TBCR
40.0000 meq | EXTENDED_RELEASE_TABLET | Freq: Once | ORAL | Status: AC
  Administered 2024-03-05: 40 meq via ORAL
  Filled 2024-03-05: qty 2

## 2024-03-05 MED ORDER — ONDANSETRON HCL 4 MG/2ML IJ SOLN
4.0000 mg | Freq: Once | INTRAMUSCULAR | Status: AC | PRN
  Administered 2024-03-05: 4 mg via INTRAVENOUS
  Filled 2024-03-05: qty 2

## 2024-03-05 MED ORDER — SENNOSIDES-DOCUSATE SODIUM 8.6-50 MG PO TABS: 1.0000 | ORAL_TABLET | Freq: Every evening | ORAL | 0 refills | Status: AC | PRN

## 2024-03-05 MED ORDER — IOHEXOL 300 MG/ML  SOLN
100.0000 mL | Freq: Once | INTRAMUSCULAR | Status: AC | PRN
  Administered 2024-03-05: 100 mL via INTRAVENOUS

## 2024-03-05 MED ORDER — MORPHINE SULFATE (PF) 4 MG/ML IV SOLN
4.0000 mg | Freq: Once | INTRAVENOUS | Status: AC
  Administered 2024-03-05: 4 mg via INTRAVENOUS
  Filled 2024-03-05: qty 1

## 2024-03-05 MED ORDER — ONDANSETRON 4 MG PO TBDP
4.0000 mg | ORAL_TABLET | Freq: Three times a day (TID) | ORAL | 0 refills | Status: AC | PRN
Start: 2024-03-05 — End: ?

## 2024-03-05 NOTE — ED Provider Notes (Signed)
 Emergency Department Provider Note   I have reviewed the triage vital signs and the nursing notes.   HISTORY  Chief Complaint Abdominal Pain and Emesis   HPI Donald Curtis is a 23 y.o. male with past history reviewed below presents to the emergency department with abdominal pain and vomiting.  Most of his pain is in the right lower abdomen.  He was apparently eating a hamburger and symptoms began shortly afterward.  He has had multiple episodes of nonbloody emesis.  No fevers.  Mom at bedside denies any prior history of abdominal surgery. Patient denies any drugs or EtOH.  Past Medical History:  Diagnosis Date   Adolescent idiopathic scoliosis    Allergy    Auditory processing disorder 2014   Chronic tonsillitis    Migraines    Reflux esophagitis     Review of Systems  Constitutional: No fever/chills Cardiovascular: Denies chest pain. Respiratory: Denies shortness of breath. Gastrointestinal: Positive RLQ abdominal pain. Positive nausea and vomiting.  Neurological: Negative for headaches.  ____________________________________________   PHYSICAL EXAM:  VITAL SIGNS: ED Triage Vitals  Encounter Vitals Group     BP 03/05/24 0049 (!) 171/86     Pulse Rate 03/05/24 0049 (!) 115     Resp 03/05/24 0049 18     Temp 03/05/24 0049 97.6 F (36.4 C)     Temp Source 03/05/24 0049 Oral     SpO2 03/05/24 0049 100 %     Weight 03/05/24 0050 113 lb 12.8 oz (51.6 kg)     Height 03/05/24 0050 5' 8 (1.727 m)   Constitutional: Alert and oriented. Actively vomiting on my initial assessment.  Eyes: Conjunctivae are normal.  Head: Atraumatic. Nose: No congestion/rhinnorhea. Mouth/Throat: Mucous membranes are moist.  Neck: No stridor.  Cardiovascular: Tachycardia. Good peripheral circulation. Grossly normal heart sounds.   Respiratory: Normal respiratory effort.  No retractions. Lungs CTAB. Gastrointestinal: Soft with diffuse tenderness. No peritonitis. No distention.   Musculoskeletal: No gross deformities of extremities. Neurologic:  Normal speech and language. Skin:  Skin is warm, dry and intact. No rash noted. ____________________________________________   LABS (all labs ordered are listed, but only abnormal results are displayed)  Labs Reviewed  COMPREHENSIVE METABOLIC PANEL WITH GFR - Abnormal; Notable for the following components:      Result Value   Potassium 2.6 (*)    CO2 19 (*)    Glucose, Bld 179 (*)    BUN 21 (*)    Anion gap 20 (*)    All other components within normal limits  CBC - Abnormal; Notable for the following components:   WBC 15.4 (*)    HCT 38.6 (*)    MCHC 36.3 (*)    All other components within normal limits  URINALYSIS, ROUTINE W REFLEX MICROSCOPIC - Abnormal; Notable for the following components:   APPearance HAZY (*)    Hgb urine dipstick LARGE (*)    Ketones, ur 40 (*)    All other components within normal limits  URINE DRUG SCREEN - Abnormal; Notable for the following components:   Opiates POSITIVE (*)    Tetrahydrocannabinol POSITIVE (*)    All other components within normal limits  URINALYSIS, MICROSCOPIC (REFLEX) - Abnormal; Notable for the following components:   Bacteria, UA RARE (*)    All other components within normal limits  LIPASE, BLOOD   ____________________________________________  RADIOLOGY  CT ABDOMEN PELVIS W CONTRAST Result Date: 03/05/2024 EXAM: CT ABDOMEN AND PELVIS WITH CONTRAST 03/05/2024 02:01:18 AM TECHNIQUE: CT  of the abdomen and pelvis was performed with the administration of 100 mL iohexol  (OMNIPAQUE ) 300 MG/ML solution. Multiplanar reformatted images are provided for review. Automated exposure control, iterative reconstruction, and/or weight-based adjustment of the mA/kV was utilized to reduce the radiation dose to as low as reasonably achievable. COMPARISON: None available. CLINICAL HISTORY: RLQ abdominal pain. FINDINGS: LOWER CHEST: No acute abnormality. LIVER: The liver is  unremarkable. GALLBLADDER AND BILE DUCTS: Gallbladder is unremarkable. No biliary ductal dilatation. SPLEEN: No acute abnormality. PANCREAS: No acute abnormality. ADRENAL GLANDS: No acute abnormality. KIDNEYS, URETERS AND BLADDER: No stones in the kidneys or ureters. No hydronephrosis. No perinephric or periureteral stranding. 3 mm stone layering dependently in the urinary bladder, likely recently passed stone. GI AND BOWEL: Stomach demonstrates no acute abnormality. There is no bowel obstruction. Normal appendix. PERITONEUM AND RETROPERITONEUM: No ascites. No free air. VASCULATURE: Aorta is normal in caliber. LYMPH NODES: No lymphadenopathy. REPRODUCTIVE ORGANS: No acute abnormality. BONES AND SOFT TISSUES: Mild thoracolumbar scoliosis. No acute osseous abnormality. No focal soft tissue abnormality. IMPRESSION: 1. 3 mm stone layering dependently in the urinary bladder, likely recently passed stone. 2. No acute findings in the abdomen or pelvis. Electronically signed by: Franky Crease MD 03/05/2024 02:06 AM EST RP Workstation: [id number]    ____________________________________________   PROCEDURES  Procedure(s) performed:   Procedures  None  ____________________________________________   INITIAL IMPRESSION / ASSESSMENT AND PLAN / ED COURSE  Pertinent labs & imaging results that were available during my care of the patient were reviewed by me and considered in my medical decision making (see chart for details).   This patient is Presenting for Evaluation of abdominal pain, which does require a range of treatment options, and is a complaint that involves a high risk of morbidity and mortality.  The Differential Diagnoses includes but is not exclusive to acute appendicitis, renal colic, testicular torsion, urinary tract infection, prostatitis,  diverticulitis, small bowel obstruction, colitis, abdominal aortic aneurysm, gastroenteritis, constipation etc.   Critical Interventions-    Medications   ondansetron  (ZOFRAN ) injection 4 mg (4 mg Intravenous Given 03/05/24 0102)  sodium chloride  0.9 % bolus 1,000 mL (0 mLs Intravenous Stopped 03/05/24 0241)  morphine  (PF) 4 MG/ML injection 4 mg (4 mg Intravenous Given 03/05/24 0108)  iohexol  (OMNIPAQUE ) 300 MG/ML solution 100 mL (100 mLs Intravenous Contrast Given 03/05/24 0151)  potassium chloride  SA (KLOR-CON  M) CR tablet 40 mEq (40 mEq Oral Given 03/05/24 0240)    Reassessment after intervention: pain/nausea improved.    I did obtain Additional Historical Information from Mom at bedside.  I decided to review pertinent External Data, and in summary no recent ED visits in our system for similar.  Clinical Laboratory Tests Ordered, included CMP shows potassium of 2.6.  No acute kidney injury.  CBC with leukocytosis to 15.4.  UA shows hemoglobin but no sign of infection.  Radiologic Tests Ordered, included CT abdomen/pelvis. I independently interpreted the images and agree with radiology interpretation.   Cardiac Monitor Tracing which shows sinus tachycardia.   Social Determinants of Health Risk patient is a non-smoker.   Medical Decision Making: Summary:  The patient presents emergency department acute onset abdominal pain and vomiting.  Seems consistent with either food poisoning or acute viral process.  He does have diffuse tenderness on exam but is actively having symptoms and vomiting on my initial assessment.  Plan for CT imaging and reassess after IVF and meds.   Reevaluation with update and discussion with patient.  Pain has resolved.  CT  shows stone in the dependent portion of the bladder.  This seems to be the cause of the patient's symptoms.  Plan for additional pain medication and potassium supplementation.  Patient is tolerating PO in the ED.   Considered admission but symptoms improved.   Patient's presentation is most consistent with acute presentation with potential threat to life or bodily function.   Disposition:  discharge  ____________________________________________  FINAL CLINICAL IMPRESSION(S) / ED DIAGNOSES  Final diagnoses:  Right lower quadrant abdominal pain  Nausea and vomiting, unspecified vomiting type  Hypokalemia     NEW OUTPATIENT MEDICATIONS STARTED DURING THIS VISIT:  New Prescriptions   ONDANSETRON  (ZOFRAN -ODT) 4 MG DISINTEGRATING TABLET    Take 1 tablet (4 mg total) by mouth every 8 (eight) hours as needed.   OXYCODONE -ACETAMINOPHEN  (PERCOCET/ROXICET) 5-325 MG TABLET    Take 1 tablet by mouth every 6 (six) hours as needed for severe pain (pain score 7-10).   POTASSIUM CHLORIDE  SA (KLOR-CON  M) 20 MEQ TABLET    Take 2 tablets (40 mEq total) by mouth daily for 4 days.   SENNA-DOCUSATE (SENOKOT-S) 8.6-50 MG TABLET    Take 1 tablet by mouth at bedtime as needed for mild constipation or moderate constipation.    Note:  This document was prepared using Dragon voice recognition software and may include unintentional dictation errors.  Fonda Law, MD, [facility]    Ebon Ketchum, Fonda MATSU, MD 03/05/24 [phone]

## 2024-03-05 NOTE — ED Triage Notes (Signed)
 Pt reports approximately an hour ago he started having right lower quadrant abdominal after eating a burger.  Reports nausea and emesis x3.

## 2024-03-05 NOTE — Discharge Instructions (Signed)
 Your CT scan today showed possible passage of a kidney stone.  I suspect this may have led to the blood in the urine and severe pain.  I am sending you home with pain and nausea medications.  The Percocet can be habit-forming.  You cannot take it while driving or with alcohol.  It will cause constipation if you have to take this medication.  Your potassium is also low and I will put you on potassium pills for the next several days.  Please schedule repeat lab work with your primary care doctor in the coming week.  Return with any fever or worsening abdominal pain.

## 2024-03-27 ENCOUNTER — Encounter: Payer: Self-pay | Admitting: Internal Medicine

## 2024-03-27 ENCOUNTER — Ambulatory Visit: Admitting: Internal Medicine

## 2024-03-27 VITALS — BP 142/82 | HR 114 | Ht 68.0 in | Wt 117.2 lb

## 2024-03-27 DIAGNOSIS — N200 Calculus of kidney: Secondary | ICD-10-CM | POA: Insufficient documentation

## 2024-03-27 DIAGNOSIS — R110 Nausea: Secondary | ICD-10-CM | POA: Diagnosis not present

## 2024-03-27 DIAGNOSIS — R1031 Right lower quadrant pain: Secondary | ICD-10-CM | POA: Diagnosis not present

## 2024-03-27 MED ORDER — PROMETHAZINE HCL 25 MG PO TABS: 25.0000 mg | ORAL_TABLET | Freq: Four times a day (QID) | ORAL | 1 refills | Status: AC | PRN

## 2024-03-27 MED ORDER — FAMOTIDINE 40 MG PO TABS: 40.0000 mg | ORAL_TABLET | Freq: Every day | ORAL | 1 refills | Status: AC

## 2024-03-27 MED ORDER — FLUOXETINE HCL 40 MG PO CAPS: 40.0000 mg | ORAL_CAPSULE | Freq: Every day | ORAL | 1 refills | Status: AC

## 2024-03-27 NOTE — Progress Notes (Unsigned)
 Subjective:  Patient ID: Donald Curtis, male    DOB: 21-Dec-2000  Age: 23 y.o. MRN: [id number]  CC: Hospitalization Follow-up [facility] follow up 04/04/24 for right lower quadrant pain. Nausea and vomiting. Hypokalemia)   HPI Donald Curtis presents for recuurrent abd pain C/o renal stone 3 mm in 02/2024 and RLQ pain prior  Outpatient Medications Prior to Visit  Medication Sig Dispense Refill   loratadine  (CLARITIN ) 10 MG tablet Take 1 tablet (10 mg total) by mouth daily. 30 tablet 6   Multiple Vitamin (MULTIVITAMIN) tablet Take 1 tablet by mouth daily.     ondansetron  (ZOFRAN -ODT) 4 MG disintegrating tablet Take 1 tablet (4 mg total) by mouth every 8 (eight) hours as needed. 20 tablet 0   oxyCODONE -acetaminophen  (PERCOCET/ROXICET) 5-325 MG tablet Take 1 tablet by mouth every 6 (six) hours as needed for severe pain (pain score 7-10). 12 tablet 0   potassium chloride  SA (KLOR-CON  M) 20 MEQ tablet Take 2 tablets (40 mEq total) by mouth daily for 4 days. 8 tablet 0   senna-docusate (SENOKOT-S) 8.6-50 MG tablet Take 1 tablet by mouth at bedtime as needed for mild constipation or moderate constipation. 20 tablet 0   famotidine  (PEPCID ) 40 MG tablet Take 1 tablet (40 mg total) by mouth daily. 90 tablet 1   FLUoxetine  (PROZAC ) 40 MG capsule Take 1 capsule (40 mg total) by mouth daily. 90 capsule 1   promethazine  (PHENERGAN ) 25 MG tablet Take 1 tablet (25 mg total) by mouth every 6 (six) hours as needed for nausea or vomiting. 60 tablet 1   No facility-administered medications prior to visit.    ROS: Review of Systems  Constitutional:  Negative for appetite change, fatigue and unexpected weight change.  HENT:  Negative for congestion, nosebleeds, sneezing, sore throat and trouble swallowing.   Eyes:  Negative for itching and visual disturbance.  Respiratory:  Negative for cough.   Cardiovascular:  Negative for chest pain, palpitations and leg swelling.  Gastrointestinal:   Positive for abdominal pain. Negative for abdominal distention, blood in stool, diarrhea and nausea.  Genitourinary:  Negative for frequency and hematuria.  Musculoskeletal:  Negative for back pain, gait problem, joint swelling and neck pain.  Skin:  Negative for rash.  Neurological:  Negative for dizziness, tremors, speech difficulty and weakness.  Psychiatric/Behavioral:  Negative for agitation, dysphoric mood, sleep disturbance and suicidal ideas. The patient is not nervous/anxious.     Objective:  BP (!) 142/82   Pulse (!) 114   Ht 5' 8 (1.727 m)   Wt 117 lb 3.2 oz (53.2 kg)   SpO2 95%   BMI 17.82 kg/m   BP Readings from Last 3 Encounters:  03/27/24 (!) 142/82  03/05/24 115/79  11/27/23 126/82    Wt Readings from Last 3 Encounters:  03/27/24 117 lb 3.2 oz (53.2 kg)  03/05/24 113 lb 12.8 oz (51.6 kg)  11/27/23 107 lb (48.5 kg)    Physical Exam Constitutional:      General: He is not in acute distress.    Appearance: He is well-developed. He is not toxic-appearing.     Comments: NAD  Eyes:     Conjunctiva/sclera: Conjunctivae normal.     Pupils: Pupils are equal, round, and reactive to light.  Neck:     Thyroid: No thyromegaly.     Vascular: No JVD.  Cardiovascular:     Rate and Rhythm: Normal rate and regular rhythm.     Heart sounds: Normal heart sounds. No murmur  heard.    No friction rub. No gallop.  Pulmonary:     Effort: Pulmonary effort is normal. No respiratory distress.     Breath sounds: Normal breath sounds. No wheezing or rales.  Chest:     Chest wall: No tenderness.  Abdominal:     General: Bowel sounds are normal. There is no distension.     Palpations: Abdomen is soft. There is no mass.     Tenderness: There is no abdominal tenderness. There is no guarding or rebound.  Musculoskeletal:        General: No tenderness. Normal range of motion.     Cervical back: Normal range of motion.  Lymphadenopathy:     Cervical: No cervical adenopathy.   Skin:    General: Skin is warm and dry.     Findings: No rash.  Neurological:     Mental Status: He is alert and oriented to person, place, and time.     Cranial Nerves: No cranial nerve deficit.     Motor: No abnormal muscle tone.     Coordination: Coordination normal.     Gait: Gait normal.     Deep Tendon Reflexes: Reflexes are normal and symmetric.  Psychiatric:        Behavior: Behavior normal.        Thought Content: Thought content normal.        Judgment: Judgment normal.     Lab Results  Component Value Date   WBC 15.4 (H) 03/05/2024   HGB 14.0 03/05/2024   HCT 38.6 (L) 03/05/2024   PLT 249 03/05/2024   GLUCOSE 179 (H) 03/05/2024   ALT 17 03/05/2024   AST 32 03/05/2024   NA 139 03/05/2024   K 2.6 (LL) 03/05/2024   CL 99 03/05/2024   CREATININE 0.96 03/05/2024   BUN 21 (H) 03/05/2024   CO2 19 (L) 03/05/2024   TSH 0.98 10/25/2021    CT ABDOMEN PELVIS W CONTRAST Result Date: 03/05/2024 EXAM: CT ABDOMEN AND PELVIS WITH CONTRAST 03/05/2024 02:01:18 AM TECHNIQUE: CT of the abdomen and pelvis was performed with the administration of 100 mL iohexol  (OMNIPAQUE ) 300 MG/ML solution. Multiplanar reformatted images are provided for review. Automated exposure control, iterative reconstruction, and/or weight-based adjustment of the mA/kV was utilized to reduce the radiation dose to as low as reasonably achievable. COMPARISON: None available. CLINICAL HISTORY: RLQ abdominal pain. FINDINGS: LOWER CHEST: No acute abnormality. LIVER: The liver is unremarkable. GALLBLADDER AND BILE DUCTS: Gallbladder is unremarkable. No biliary ductal dilatation. SPLEEN: No acute abnormality. PANCREAS: No acute abnormality. ADRENAL GLANDS: No acute abnormality. KIDNEYS, URETERS AND BLADDER: No stones in the kidneys or ureters. No hydronephrosis. No perinephric or periureteral stranding. 3 mm stone layering dependently in the urinary bladder, likely recently passed stone. GI AND BOWEL: Stomach demonstrates  no acute abnormality. There is no bowel obstruction. Normal appendix. PERITONEUM AND RETROPERITONEUM: No ascites. No free air. VASCULATURE: Aorta is normal in caliber. LYMPH NODES: No lymphadenopathy. REPRODUCTIVE ORGANS: No acute abnormality. BONES AND SOFT TISSUES: Mild thoracolumbar scoliosis. No acute osseous abnormality. No focal soft tissue abnormality. IMPRESSION: 1. 3 mm stone layering dependently in the urinary bladder, likely recently passed stone. 2. No acute findings in the abdomen or pelvis. Electronically signed by: Franky Crease MD 03/05/2024 02:06 AM EST RP Workstation: [id number]    Assessment & Plan:   Problem List Items Addressed This Visit     Kidney stone - Primary      Meds ordered this encounter  Medications  famotidine  (PEPCID ) 40 MG tablet    Sig: Take 1 tablet (40 mg total) by mouth daily.    Dispense:  90 tablet    Refill:  1    Schedule office visit   FLUoxetine  (PROZAC ) 40 MG capsule    Sig: Take 1 capsule (40 mg total) by mouth daily.    Dispense:  90 capsule    Refill:  1   promethazine  (PHENERGAN ) 25 MG tablet    Sig: Take 1 tablet (25 mg total) by mouth every 6 (six) hours as needed for nausea or vomiting.    Dispense:  60 tablet    Refill:  1    Schedule office visit      Follow-up: Return in about 3 months (around 06/25/2024) for a follow-up visit.  Marolyn Noel, MD

## 2024-03-28 DIAGNOSIS — R109 Unspecified abdominal pain: Secondary | ICD-10-CM | POA: Insufficient documentation

## 2024-03-28 NOTE — Assessment & Plan Note (Signed)
Resolved.  No relapse. 

## 2024-03-28 NOTE — Assessment & Plan Note (Signed)
 This time he has abdominal pain was different.  It was sudden and extremely severe.  He went to the emergency room immediately.  Renal stone 3 mm in 02/2024.

## 2024-03-28 NOTE — Assessment & Plan Note (Signed)
 This time he has abdominal pain was different.  It was sudden and extremely severe.  He went to the emergency room immediately.  He was diagnosed with a small kidney stone in his bladder. Renal stone 3 mm in 02/2024 and RLQ pain prior.  The symptoms lasted for about an hour Discussed.  Advised good hydration
